# Patient Record
Sex: Female | Born: 2000 | Race: White | Hispanic: No | Marital: Single | Smoking: Never smoker
Health system: Southern US, Community
[De-identification: ages and names within clinical notes are randomized; demographics above are authoritative.]

---
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
## 2022-07-25 ENCOUNTER — Emergency Department (HOSPITAL_COMMUNITY)
Admission: EM | Admit: 2022-07-25 | Discharge: 2022-07-25 | Disposition: A | Discharge status: Home / Self Care | Payer: 59 | Attending: Emergency Medicine | Admitting: Emergency Medicine

## 2022-07-25 ENCOUNTER — Emergency Department (HOSPITAL_COMMUNITY): Payer: 59

## 2022-07-25 ENCOUNTER — Other Ambulatory Visit: Payer: Self-pay

## 2022-07-25 ENCOUNTER — Encounter (HOSPITAL_COMMUNITY): Payer: Self-pay | Admitting: *Deleted

## 2022-07-25 DIAGNOSIS — R000 Tachycardia, unspecified: Secondary | ICD-10-CM

## 2022-07-25 DIAGNOSIS — F419 Anxiety disorder, unspecified: Secondary | ICD-10-CM | POA: Diagnosis not present

## 2022-07-25 DIAGNOSIS — R0602 Shortness of breath: Secondary | ICD-10-CM | POA: Diagnosis not present

## 2022-07-25 LAB — CBC WITH DIFFERENTIAL/PLATELET
Abs Immature Granulocytes: 0.03 10*3/uL (ref 0.00–0.07)
Basophils Absolute: 0 10*3/uL (ref 0.0–0.1)
Basophils Relative: 1 %
Eosinophils Absolute: 0 10*3/uL (ref 0.0–0.5)
Eosinophils Relative: 0 %
HCT: 43.8 % (ref 36.0–46.0)
Hemoglobin: 15.6 g/dL — ABNORMAL HIGH (ref 12.0–15.0)
Immature Granulocytes: 0 %
Lymphocytes Relative: 16 %
Lymphs Abs: 1.4 10*3/uL (ref 0.7–4.0)
MCH: 32.9 pg (ref 26.0–34.0)
MCHC: 35.6 g/dL (ref 30.0–36.0)
MCV: 92.4 fL (ref 80.0–100.0)
Monocytes Absolute: 0.4 10*3/uL (ref 0.1–1.0)
Monocytes Relative: 5 %
Neutro Abs: 6.5 10*3/uL (ref 1.7–7.7)
Neutrophils Relative %: 78 %
Platelets: 220 10*3/uL (ref 150–400)
RBC: 4.74 MIL/uL (ref 3.87–5.11)
RDW: 11.5 % (ref 11.5–15.5)
WBC: 8.3 10*3/uL (ref 4.0–10.5)
nRBC: 0 % (ref 0.0–0.2)

## 2022-07-25 LAB — URINALYSIS, ROUTINE W REFLEX MICROSCOPIC
Bilirubin Urine: NEGATIVE
Glucose, UA: NEGATIVE mg/dL
Hgb urine dipstick: NEGATIVE
Ketones, ur: 20 mg/dL — AB
Nitrite: NEGATIVE
Protein, ur: NEGATIVE mg/dL
Specific Gravity, Urine: 1.006 (ref 1.005–1.030)
pH: 7 (ref 5.0–8.0)

## 2022-07-25 LAB — COMPREHENSIVE METABOLIC PANEL
ALT: 23 U/L (ref 0–44)
AST: 25 U/L (ref 15–41)
Albumin: 4.8 g/dL (ref 3.5–5.0)
Alkaline Phosphatase: 33 U/L — ABNORMAL LOW (ref 38–126)
Anion gap: 13 (ref 5–15)
BUN: 12 mg/dL (ref 6–20)
CO2: 19 mmol/L — ABNORMAL LOW (ref 22–32)
Calcium: 9.6 mg/dL (ref 8.9–10.3)
Chloride: 100 mmol/L (ref 98–111)
Creatinine, Ser: 0.84 mg/dL (ref 0.44–1.00)
GFR, Estimated: >60 mL/min (ref >60)
Glucose, Bld: 141 mg/dL — ABNORMAL HIGH (ref 70–99)
Potassium: 3.5 mmol/L (ref 3.5–5.1)
Sodium: 132 mmol/L — ABNORMAL LOW (ref 135–145)
Total Bilirubin: 1.1 mg/dL (ref 0.3–1.2)
Total Protein: 8.2 g/dL — ABNORMAL HIGH (ref 6.5–8.1)

## 2022-07-25 LAB — MAGNESIUM: Magnesium: 2 mg/dL (ref 1.7–2.4)

## 2022-07-25 LAB — TROPONIN I (HIGH SENSITIVITY): Troponin I (High Sensitivity): <2 ng/L (ref <18)

## 2022-07-25 LAB — PREGNANCY, URINE: Preg Test, Ur: NEGATIVE

## 2022-07-25 MED ORDER — SODIUM CHLORIDE 0.9 % IV BOLUS
1000.0000 mL | Freq: Once | INTRAVENOUS | Status: AC
  Administered 2022-07-25: 1000 mL via INTRAVENOUS

## 2022-07-25 MED ORDER — SERTRALINE HCL 25 MG PO TABS: 25.0000 mg | ORAL_TABLET | Freq: Every day | ORAL | 2 refills | Status: AC

## 2022-07-25 NOTE — ED Triage Notes (Signed)
Pt brought in POV by mother from Urgent Care with c/o SOB, high heart rate and dizziness x couple of days. Mother reports she has been evaluated for this and everyone seems to think it's because of anxiety, but pt reports her heart rate goes up first then she starts to get anxious.

## 2022-07-25 NOTE — Discharge Instructions (Signed)
I would like for you to follow-up with your family doctor for a recheck.  If you would like to start some medication for anxiety I would recommend sertraline 1 tablet a day.  I have prescribed this medication for you.  This may or may not help but I would think it would be reasonable to try it.  Please drink plenty of clear liquids, make a follow-up with your doctor this week, you may need referral to a psychiatrist.

## 2022-07-25 NOTE — ED Provider Notes (Signed)
[facility] Provider Note   CSN: [id number] Arrival date & time: 07/25/22  1149     History  Chief Complaint  Patient presents with   Increased Heart Rate    Carolyn Hurst is a 22 y.o. female.  HPI   This patient is a 22 year old female, comes from urgent care today with a complaint of shortness of breath dizziness and feeling like her heart is racing.  Reports that she is not sleeping that well, feels like her mind is racing, is unclear whether she is having anxiety causing the heart rate or whether the heart rate is causing the anxiety.  She does report that she had COVID in the last couple of months and this can of started after that.  She did not really have any significant problems with anxiety prior to that.  She works as a Art therapist in [employer] and has had difficulty because of lightheadedness at work.  She has had decreased appetite, 1 episode of diarrhea yesterday, and general no vomiting or dysuria or swelling, no headache or blurred vision.  She has recently undergone treatment for anxiety with Lexapro but it made her feel very bad, her family doctor has not referred to psychiatry yet, no formal dx of anxiety / psych disorder.  Home Medications Prior to Admission medications   Medication Sig Start Date End Date Taking? Authorizing Provider  sertraline (ZOLOFT) 25 MG tablet Take 1 tablet (25 mg total) by mouth daily. 07/25/22  Yes Noemi Chapel, MD      Allergies    Patient has no allergy information on record.    Review of Systems   Review of Systems  All other systems reviewed and are negative.   Physical Exam Updated Vital Signs BP (!) 126/94 (BP Location: Left Arm)   Pulse (!) 133   Temp 98.4 F (36.9 C) (Oral)   Resp 12   Ht 1.727 m ('5\' 8"'$ )   Wt 54.4 kg   LMP  (Within Weeks)   SpO2 100%   BMI 18.25 kg/m  Physical Exam Vitals and nursing note reviewed.  Constitutional:      General: She is not  in acute distress.    Appearance: She is well-developed.     Comments: Mildly anxious appearing  HENT:     Head: Normocephalic and atraumatic.     Mouth/Throat:     Pharynx: No oropharyngeal exudate.  Eyes:     General: No scleral icterus.       Right eye: No discharge.        Left eye: No discharge.     Conjunctiva/sclera: Conjunctivae normal.     Pupils: Pupils are equal, round, and reactive to light.  Neck:     Thyroid: No thyromegaly.     Vascular: No JVD.  Cardiovascular:     Rate and Rhythm: Regular rhythm. Tachycardia present.     Heart sounds: Normal heart sounds. No murmur heard.    No friction rub. No gallop.     Comments: Sinus tach 120 bpm Pulmonary:     Effort: Pulmonary effort is normal. No respiratory distress.     Breath sounds: Normal breath sounds. No wheezing or rales.  Abdominal:     General: Bowel sounds are normal. There is no distension.     Palpations: Abdomen is soft. There is no mass.     Tenderness: There is no abdominal tenderness.  Musculoskeletal:  General: No tenderness. Normal range of motion.     Cervical back: Normal range of motion and neck supple.     Right lower leg: No edema.     Left lower leg: No edema.  Lymphadenopathy:     Cervical: No cervical adenopathy.  Skin:    General: Skin is warm and dry.     Findings: No erythema or rash.  Neurological:     General: No focal deficit present.     Mental Status: She is alert.     Coordination: Coordination normal.  Psychiatric:     Comments: Anxious appearing, no hallucinations or internal stimuli     ED Results / Procedures / Treatments   Labs (all labs ordered are listed, but only abnormal results are displayed) Labs Reviewed  CBC WITH DIFFERENTIAL/PLATELET - Abnormal; Notable for the following components:      Result Value   Hemoglobin 15.6 (*)    All other components within normal limits  COMPREHENSIVE METABOLIC PANEL - Abnormal; Notable for the following components:    Sodium 132 (*)    CO2 19 (*)    Glucose, Bld 141 (*)    Total Protein 8.2 (*)    Alkaline Phosphatase 33 (*)    All other components within normal limits  URINALYSIS, ROUTINE W REFLEX MICROSCOPIC - Abnormal; Notable for the following components:   Color, Urine STRAW (*)    Ketones, ur 20 (*)    Leukocytes,Ua SMALL (*)    Bacteria, UA MANY (*)    All other components within normal limits  PREGNANCY, URINE  MAGNESIUM  TROPONIN I (HIGH SENSITIVITY)    EKG EKG Interpretation  Date/Time:  Saturday July 25 2022 12:06:26 EST Ventricular Rate:  102 PR Interval:  125 QRS Duration: 86 QT Interval:  338 QTC Calculation: 441 R Axis:   96 Text Interpretation: Sinus tachycardia Consider left atrial enlargement Borderline right axis deviation Borderline repolarization abnormality No old tracing to compare Confirmed by Noemi Chapel [phone]) on 07/25/2022 12:29:51 PM  Radiology DG Chest 2 View  Result Date: 07/25/2022 CLINICAL DATA:  Shortness of breath.  Heart rate.  Dizziness. EXAM: CHEST - 2 VIEW COMPARISON:  None Available. FINDINGS: The heart size and mediastinal contours are within normal limits. Both lungs are clear. The visualized skeletal structures are unremarkable. IMPRESSION: No active cardiopulmonary disease. Electronically Signed   By: Dorise Bullion III M.D.   On: 07/25/2022 12:41    Procedures Procedures    Medications Ordered in ED Medications  sodium chloride 0.9 % bolus 1,000 mL (1,000 mLs Intravenous New Bag/Given 07/25/22 1338)    ED Course/ Medical Decision Making/ A&P                             Medical Decision Making Amount and/or Complexity of Data Reviewed Labs: ordered. Radiology: ordered. ECG/medicine tests: ordered.  Risk Prescription drug management.   This patient presents to the ED for concern of tachycardia and anxiety differential diagnosis includes could be a post-COVID condition causing the tachycardia, this could also be related to primary  anxiety, would also consider other causes as well but EKG will be necessary, labs, chest x-ray    Additional history obtained:  Additional history obtained from electronic medical record External records from outside source obtained and reviewed including office visits, seen recently in January at family doctor   Lab Tests:  I Ordered, and personally interpreted labs.  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  I have prescribed sertraline for her to help with some of this anxiety, she is willing to give this a try.  Patient is stable for discharge at this time   Social Determinants of Health:  Anxiety           Final Clinical Impression(s) / ED Diagnoses Final diagnoses:  Tachycardia  Anxiety    Rx / DC Orders ED Discharge Orders          Ordered    sertraline (ZOLOFT) 25 MG tablet  Daily        07/25/22 1416              Noemi Chapel, MD 07/25/22 1417
